# Patient Record
Sex: Male | Born: 1995 | Race: White | Hispanic: No | Marital: Single | Smoking: Never smoker
Health system: Southern US, Community
[De-identification: ages and names within clinical notes are randomized; demographics above are authoritative.]

---

## 2013-12-05 ENCOUNTER — Ambulatory Visit: Payer: BC Managed Care – PPO

## 2013-12-05 ENCOUNTER — Ambulatory Visit (INDEPENDENT_AMBULATORY_CARE_PROVIDER_SITE_OTHER): Payer: BC Managed Care – PPO | Admitting: Emergency Medicine

## 2013-12-05 VITALS — BP 118/70 | HR 78 | Temp 98.0°F | Resp 18 | Ht 76.5 in | Wt 197.0 lb

## 2013-12-05 DIAGNOSIS — S62609A Fracture of unspecified phalanx of unspecified finger, initial encounter for closed fracture: Secondary | ICD-10-CM

## 2013-12-05 DIAGNOSIS — S62509A Fracture of unspecified phalanx of unspecified thumb, initial encounter for closed fracture: Secondary | ICD-10-CM

## 2013-12-05 DIAGNOSIS — M79646 Pain in unspecified finger(s): Secondary | ICD-10-CM

## 2013-12-05 DIAGNOSIS — M79609 Pain in unspecified limb: Secondary | ICD-10-CM

## 2013-12-05 MED ORDER — ACETAMINOPHEN-CODEINE #3 300-30 MG PO TABS: 1.0000 | ORAL_TABLET | ORAL | Status: AC | PRN

## 2013-12-05 NOTE — Patient Instructions (Signed)
Finger Fracture  Fractures of fingers are breaks in the bones of the fingers. There are many types of fractures. There are different ways of treating these fractures. Your health care provider will discuss the best way to treat your fracture.  CAUSES  Traumatic injury is the main cause of broken fingers. These include:  · Injuries while playing sports.  · Workplace injuries.  · Falls.  RISK FACTORS  Activities that can increase your risk of finger fractures include:  · Sports.  · Workplace activities that involve machinery.  · A condition called osteoporosis, which can make your bones less dense and cause them to fracture more easily.  SIGNS AND SYMPTOMS  The main symptoms of a broken finger are pain and swelling within 15 minutes after the injury. Other symptoms include:  · Bruising of your finger.  · Stiffness of your finger.  · Numbness of your finger.  · Exposed bones (compound fracture) if the fracture is severe.  DIAGNOSIS   The best way to diagnose a broken bone is with X-ray imaging. Additionally, your health care provider will use this X-ray image to evaluate the position of the broken finger bones.   TREATMENT   Finger fractures can be treated with:   · Nonreduction This means the bones are in place. The finger is splinted without changing the positions of the bone pieces. The splint is usually left on for about a week to 10 days. This will depend on your fracture and what your health care provider thinks.  · Closed reduction The bones are put back into position without using surgery. The finger is then splinted.  · Open reduction and internal fixation The fracture site is opened. Then the bone pieces are fixed into place with pins or some type of hardware. This is seldom required. It depends on the severity of the fracture.  HOME CARE INSTRUCTIONS   · Follow your health care provider's instructions regarding activities, exercises, and physical therapy.  · Only take over-the-counter or prescription  medicines for pain, discomfort, or fever as directed by your health care provider.  SEEK MEDICAL CARE IF:  You have pain or swelling that limits the motion or use of your fingers.  SEEK IMMEDIATE MEDICAL CARE IF:   Your finger becomes numb.  MAKE SURE YOU:   · Understand these instructions.  · Will watch your condition.  · Will get help right away if you are not doing well or get worse.  Document Released: 01/10/2001 Document Revised: 07/19/2013 Document Reviewed: 05/10/2013  [employer]® Patient Information ©2014 [employer].

## 2013-12-05 NOTE — Progress Notes (Signed)
[facility] [address], [place] [id number] [phone]  Date:  12/05/2013   Name:  Juan DoneJoseph Peri   DOB:  10/23/1995   MRN:  [id number]  PCP:  No primary provider on file.    Chief Complaint: thumb pain   History of Present Illness:  Juan DoneJoseph Reilly is a 18 y.o. very pleasant male patient who presents with the following:  Injured his left thumb while playing lacrosse.  Has pain, swelling, and ecchymosis.  Not able to use digit.  No improvement with over the counter medications or other home remedies. Denies other complaint or health concern today.   There are no active problems to display for this patient.   History reviewed. No pertinent past medical history.  History reviewed. No pertinent past surgical history.  History  Substance Use Topics  . Smoking status: Never Smoker   . Smokeless tobacco: Not on file  . Alcohol Use: No    History reviewed. No pertinent family history.  No Known Allergies  Medication list has been reviewed and updated.  No current outpatient prescriptions on file prior to visit.   No current facility-administered medications on file prior to visit.    Review of Systems:  As per HPI, otherwise negative.    Physical Examination: Filed Vitals:   12/05/13 1236  BP: 118/70  Pulse: 78  Temp: 98 F (36.7 C)  Resp: 18   Filed Vitals:   12/05/13 1236  Height: 6' 4.5" (1.943 m)  Weight: 197 lb (89.359 kg)   Body mass index is 23.67 kg/(m^2). Ideal Body Weight: Weight in (lb) to have BMI = 25: 207.7   GEN: WDWN, NAD, Non-toxic, Alert & Oriented x 3 HEENT: Atraumatic, Normocephalic.  Ears and Nose: No external deformity. EXTR: No clubbing/cyanosis/edema NEURO: Normal gait.  PSYCH: Normally interactive. Conversant. Not depressed or anxious appearing.  Calm demeanor.  LEFT thumb:  Tender and ecchymotic.  Guards.  No deformity  Assessment and Plan: Fractured proximal phalange thumb Thumb spica Tyl #  3 Ortho   Signed,  Phillips OdorJeffery Nijah Tejera, MD   [facility] reading (PRIMARY) by  Dr. Dareen PianoAnderson fracture thumb.

## 2014-06-26 IMAGING — CR DG FINGER THUMB 2+V*L*
1 series · 1 of 1 positions shown · non-contrast
Comparison: None.

CLINICAL DATA: Thumb injury from lacrosse

EXAM:
LEFT THUMB 2+V

[PA]
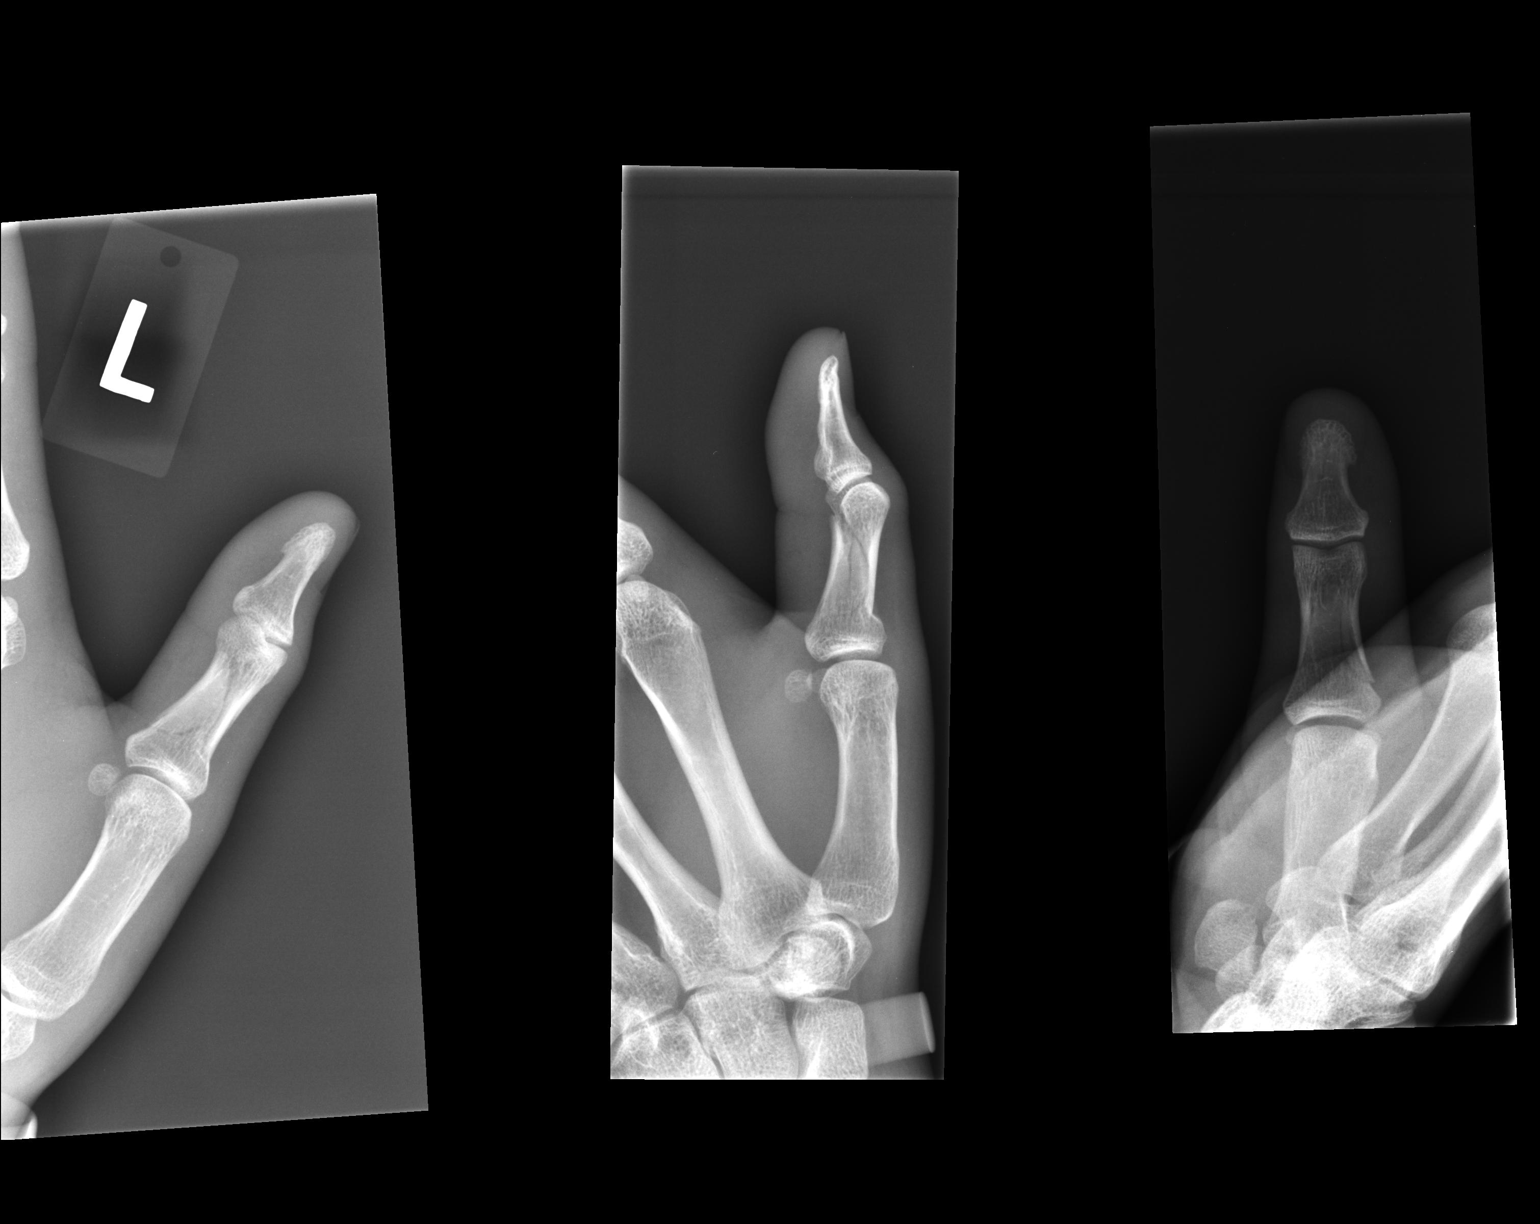

[1 of 1 positions shown; findings below may reference images not displayed]

FINDINGS: Acute longitudinally oriented fracture through the proximal phalanx
of the thumb without evidence of displacement. There is buckling of
the posterior cortex as well. Associated soft tissue swelling. No
definite articular involvement.
IMPRESSION: Acute longitudinally oriented fracture through the proximal phalanx
of the thumb without evidence of displacement or articular
involvement.
